# Patient Record
Sex: Female | Born: 1975 | Race: Black or African American | Hispanic: No | Marital: Single | Smoking: Current every day smoker
Health system: Southern US, Community
[De-identification: ages and names within clinical notes are randomized; demographics above are authoritative.]

## PROBLEM LIST (undated history)

## (undated) DIAGNOSIS — K829 Disease of gallbladder, unspecified: Secondary | ICD-10-CM

## (undated) HISTORY — PX: WISDOM TOOTH EXTRACTION: SHX21

## (undated) HISTORY — PX: CHOLECYSTECTOMY: SHX55

---

## 1998-10-15 ENCOUNTER — Other Ambulatory Visit: Admission: RE | Admit: 1998-10-15 | Discharge: 1998-10-15 | Payer: Self-pay | Admitting: Obstetrics

## 1999-02-12 ENCOUNTER — Inpatient Hospital Stay (HOSPITAL_COMMUNITY): Admission: AD | Admit: 1999-02-12 | Discharge: 1999-02-12 | Payer: Self-pay | Admitting: Obstetrics

## 1999-05-15 ENCOUNTER — Inpatient Hospital Stay (HOSPITAL_COMMUNITY): Admission: AD | Admit: 1999-05-15 | Discharge: 1999-05-17 | Payer: Self-pay | Admitting: Internal Medicine

## 2000-03-18 ENCOUNTER — Emergency Department (HOSPITAL_COMMUNITY): Admission: EM | Admit: 2000-03-18 | Discharge: 2000-03-19 | Payer: Self-pay | Admitting: Internal Medicine

## 2000-03-19 ENCOUNTER — Encounter: Payer: Self-pay | Admitting: Emergency Medicine

## 2001-08-12 ENCOUNTER — Emergency Department (HOSPITAL_COMMUNITY): Admission: EM | Admit: 2001-08-12 | Discharge: 2001-08-12 | Payer: Self-pay | Admitting: Emergency Medicine

## 2004-08-15 ENCOUNTER — Inpatient Hospital Stay (HOSPITAL_COMMUNITY): Admission: AD | Admit: 2004-08-15 | Discharge: 2004-08-15 | Payer: Self-pay | Admitting: Obstetrics

## 2005-10-24 ENCOUNTER — Inpatient Hospital Stay (HOSPITAL_COMMUNITY): Admission: AD | Admit: 2005-10-24 | Discharge: 2005-10-24 | Payer: Self-pay | Admitting: Obstetrics

## 2007-03-30 ENCOUNTER — Inpatient Hospital Stay (HOSPITAL_COMMUNITY): Admission: AD | Admit: 2007-03-30 | Discharge: 2007-03-30 | Payer: Self-pay | Admitting: Obstetrics

## 2007-06-07 ENCOUNTER — Inpatient Hospital Stay (HOSPITAL_COMMUNITY): Admission: AD | Admit: 2007-06-07 | Discharge: 2007-06-10 | Payer: Self-pay | Admitting: Obstetrics

## 2007-06-12 ENCOUNTER — Inpatient Hospital Stay (HOSPITAL_COMMUNITY): Admission: AD | Admit: 2007-06-12 | Discharge: 2007-06-12 | Payer: Self-pay | Admitting: Obstetrics

## 2011-02-06 LAB — CBC
HCT: 29.3 — ABNORMAL LOW
HCT: 32.8 — ABNORMAL LOW
Hemoglobin: 10.9 — ABNORMAL LOW
Hemoglobin: 11.1 — ABNORMAL LOW
Hemoglobin: 9.8 — ABNORMAL LOW
MCHC: 33.1
MCHC: 33.5
MCHC: 33.5
MCV: 88.8
MCV: 88.9
MCV: 88.9
Platelets: 271
Platelets: 286
RBC: 3.3 — ABNORMAL LOW
RBC: 3.69 — ABNORMAL LOW
RBC: 3.72 — ABNORMAL LOW
RDW: 15.8 — ABNORMAL HIGH
RDW: 15.9 — ABNORMAL HIGH
WBC: 10.1
WBC: 10.5
WBC: 8.6

## 2011-02-06 LAB — RH IMMUNE GLOB WKUP(>/=20WKS)(NOT WOMEN'S HOSP): Fetal Screen: NEGATIVE

## 2011-02-06 LAB — DIFFERENTIAL
Basophils Relative: 1
Lymphs Abs: 3.5
Monocytes Absolute: 0.5
Monocytes Relative: 5
Neutro Abs: 5.9

## 2011-02-06 LAB — RPR: RPR Ser Ql: NONREACTIVE

## 2011-02-24 LAB — RH IMMUNE GLOBULIN WORKUP (NOT WOMEN'S HOSP)
ABO/RH(D): O NEG
Antibody Screen: NEGATIVE

## 2012-11-07 ENCOUNTER — Encounter (INDEPENDENT_AMBULATORY_CARE_PROVIDER_SITE_OTHER): Payer: Self-pay | Admitting: Surgery

## 2012-11-08 ENCOUNTER — Encounter (INDEPENDENT_AMBULATORY_CARE_PROVIDER_SITE_OTHER): Payer: Self-pay | Admitting: Surgery

## 2012-11-08 ENCOUNTER — Ambulatory Visit (INDEPENDENT_AMBULATORY_CARE_PROVIDER_SITE_OTHER): Payer: 59 | Admitting: Surgery

## 2012-11-08 VITALS — BP 122/70 | HR 72 | Temp 98.6°F | Resp 15 | Ht 70.0 in | Wt 168.0 lb

## 2012-11-08 DIAGNOSIS — K8010 Calculus of gallbladder with chronic cholecystitis without obstruction: Secondary | ICD-10-CM | POA: Insufficient documentation

## 2012-11-08 DIAGNOSIS — K429 Umbilical hernia without obstruction or gangrene: Secondary | ICD-10-CM | POA: Insufficient documentation

## 2012-11-08 MED ORDER — OXYCODONE-ACETAMINOPHEN 5-325 MG PO TABS: 1.0000 | ORAL_TABLET | ORAL | Status: DC | PRN

## 2012-11-08 NOTE — Progress Notes (Signed)
Patient ID: Regina Preston, female   DOB: 01-20-1976, 37 y.o.   MRN: [id number]  Chief Complaint  Patient presents with  . New Evaluation    eval gallstones    HPI Regina Preston is a 37 y.o. female.  Referred by Dr. Ranee Gosselin for evaluation of gallbladder disease. HPI This is a 37 year old female who presents with a one-month history of abdominal bloating and right upper quadrant abdominal pain. This does get worse after eating especially greasy foods. She has had some nausea and vomiting but denies any diarrhea.  The pain radiates around into her back. She was evaluated at urgent care and was prescribed Nexium and promethazine. She then underwent an ultrasound on 11/07/12. This showed numerous shadowing gallstones and sludge within the gallbladder with some  Borderline wall thickness. She did have some tenderness with ultrasound. She presents now for surgical evaluation.  History reviewed. No pertinent past medical history.  History reviewed. No pertinent past surgical history.  Family History  Problem Relation Age of Onset  . Diabetes Father   . Stroke Father     Social History History  Substance Use Topics  . Smoking status: Current Every Day Smoker -- 0.50 packs/day    Types: Cigarettes  . Smokeless tobacco: Never Used  . Alcohol Use: Yes     Comment: occasionally    No Known Allergies  Current Outpatient Prescriptions  Medication Sig Dispense Refill  . NEXIUM 40 MG capsule       . promethazine (PHENERGAN) 12.5 MG tablet       . oxyCODONE-acetaminophen (PERCOCET/ROXICET) 5-325 MG per tablet Take 1 tablet by mouth every 4 (four) hours as needed for pain.  40 tablet  0   No current facility-administered medications for this visit.    Review of Systems Review of Systems  Constitutional: Negative for fever, chills and unexpected weight change.  HENT: Negative for hearing loss, congestion, sore throat, trouble swallowing and voice change.   Eyes: Negative for visual  disturbance.  Respiratory: Negative for cough and wheezing.   Cardiovascular: Negative for chest pain, palpitations and leg swelling.  Gastrointestinal: Positive for nausea, vomiting, abdominal pain and abdominal distention. Negative for diarrhea, constipation, blood in stool and anal bleeding.  Genitourinary: Negative for hematuria, vaginal bleeding and difficulty urinating.  Musculoskeletal: Negative for arthralgias.  Skin: Negative for rash and wound.  Neurological: Negative for seizures, syncope and headaches.  Hematological: Negative for adenopathy. Does not bruise/bleed easily.  Psychiatric/Behavioral: Negative for confusion.    Blood pressure 122/70, pulse 72, temperature 98.6 F (37 C), temperature source Temporal, resp. rate 15, height 5\' 10"  (1.778 m), weight 168 lb (76.204 kg).  Physical Exam Physical Exam WDWN in NAD HEENT:  EOMI, sclera anicteric Neck:  No masses, no thyromegaly Lungs:  CTA bilaterally; normal respiratory effort CV:  Regular rate and rhythm; no murmurs Abd:  +bowel sounds, soft, mildly tender in RUQ; no palpable masses Ext:  Well-perfused; no edema Skin:  Warm, dry; no sign of jaundice  Data Reviewed [place] 11/07/12 - Cholelithiasis; borderline wall thickening.  Assessment    Symptomatic cholelithiasis.       Plan    Laparoscopic cholecystectomy with intraoperative cholangiogram. The surgical procedure has been discussed with the patient.  Potential risks, benefits, alternative treatments, and expected outcomes have been explained.  All of the patient's questions at this time have been answered.  The likelihood of reaching the patient's treatment goal is good.  The patient understand the proposed surgical procedure and wishes  to proceed.         Davon Abdelaziz K. 11/08/2012, 4:43 PM

## 2012-11-14 ENCOUNTER — Telehealth (INDEPENDENT_AMBULATORY_CARE_PROVIDER_SITE_OTHER): Payer: Self-pay | Admitting: *Deleted

## 2012-11-14 ENCOUNTER — Emergency Department (HOSPITAL_COMMUNITY): Payer: 59

## 2012-11-14 ENCOUNTER — Emergency Department (HOSPITAL_COMMUNITY)
Admission: EM | Admit: 2012-11-14 | Discharge: 2012-11-14 | Disposition: A | Discharge status: Home / Self Care | Payer: 59 | Attending: Emergency Medicine | Admitting: Emergency Medicine

## 2012-11-14 ENCOUNTER — Encounter (HOSPITAL_COMMUNITY): Payer: Self-pay | Admitting: Emergency Medicine

## 2012-11-14 DIAGNOSIS — F17200 Nicotine dependence, unspecified, uncomplicated: Secondary | ICD-10-CM | POA: Insufficient documentation

## 2012-11-14 DIAGNOSIS — R109 Unspecified abdominal pain: Secondary | ICD-10-CM

## 2012-11-14 DIAGNOSIS — R197 Diarrhea, unspecified: Secondary | ICD-10-CM | POA: Insufficient documentation

## 2012-11-14 DIAGNOSIS — K8020 Calculus of gallbladder without cholecystitis without obstruction: Secondary | ICD-10-CM | POA: Insufficient documentation

## 2012-11-14 DIAGNOSIS — Z79899 Other long term (current) drug therapy: Secondary | ICD-10-CM | POA: Insufficient documentation

## 2012-11-14 DIAGNOSIS — Z3202 Encounter for pregnancy test, result negative: Secondary | ICD-10-CM | POA: Insufficient documentation

## 2012-11-14 DIAGNOSIS — R630 Anorexia: Secondary | ICD-10-CM | POA: Insufficient documentation

## 2012-11-14 DIAGNOSIS — R112 Nausea with vomiting, unspecified: Secondary | ICD-10-CM | POA: Insufficient documentation

## 2012-11-14 HISTORY — DX: Disease of gallbladder, unspecified: K82.9

## 2012-11-14 LAB — URINALYSIS, ROUTINE W REFLEX MICROSCOPIC
Ketones, ur: 15 mg/dL — AB
Leukocytes, UA: NEGATIVE
Protein, ur: NEGATIVE mg/dL
Urobilinogen, UA: 1 mg/dL (ref 0.0–1.0)

## 2012-11-14 LAB — CBC
MCH: 28.6 pg (ref 26.0–34.0)
MCHC: 34.4 g/dL (ref 30.0–36.0)
Platelets: 307 10*3/uL (ref 150–400)
RBC: 4.23 MIL/uL (ref 3.87–5.11)
RDW: 14.4 % (ref 11.5–15.5)

## 2012-11-14 LAB — HEPATIC FUNCTION PANEL
Bilirubin, Direct: <0.1 mg/dL (ref 0.0–0.3)
Total Protein: 7.4 g/dL (ref 6.0–8.3)

## 2012-11-14 LAB — POCT I-STAT, CHEM 8
Calcium, Ion: 1.27 mmol/L — ABNORMAL HIGH (ref 1.12–1.23)
HCT: 38 % (ref 36.0–46.0)
Hemoglobin: 12.9 g/dL (ref 12.0–15.0)
Sodium: 141 mEq/L (ref 135–145)
TCO2: 28 mmol/L (ref 0–100)

## 2012-11-14 LAB — POCT PREGNANCY, URINE: Preg Test, Ur: NEGATIVE

## 2012-11-14 LAB — LIPASE, BLOOD: Lipase: 11 U/L (ref 11–59)

## 2012-11-14 MED ORDER — ONDANSETRON HCL 4 MG/2ML IJ SOLN
4.0000 mg | Freq: Once | INTRAMUSCULAR | Status: AC
  Administered 2012-11-14: 4 mg via INTRAVENOUS
  Filled 2012-11-14: qty 2

## 2012-11-14 MED ORDER — HYDROMORPHONE HCL PF 1 MG/ML IJ SOLN: 1.0000 mg | Freq: Once | INTRAMUSCULAR | Status: DC

## 2012-11-14 MED ORDER — IOHEXOL 300 MG/ML  SOLN
100.0000 mL | Freq: Once | INTRAMUSCULAR | Status: AC | PRN
  Administered 2012-11-14: 100 mL via INTRAVENOUS

## 2012-11-14 MED ORDER — DICYCLOMINE HCL 20 MG PO TABS: 20.0000 mg | ORAL_TABLET | Freq: Two times a day (BID) | ORAL | Status: DC

## 2012-11-14 MED ORDER — SODIUM CHLORIDE 0.9 % IV BOLUS (SEPSIS)
1000.0000 mL | Freq: Once | INTRAVENOUS | Status: AC
  Administered 2012-11-14: 1000 mL via INTRAVENOUS

## 2012-11-14 MED ORDER — KETOROLAC TROMETHAMINE 15 MG/ML IJ SOLN
15.0000 mg | Freq: Once | INTRAMUSCULAR | Status: AC
  Administered 2012-11-14: 15 mg via INTRAVENOUS
  Filled 2012-11-14: qty 1

## 2012-11-14 MED ORDER — GI COCKTAIL ~~LOC~~
30.0000 mL | Freq: Once | ORAL | Status: AC
  Administered 2012-11-14: 30 mL via ORAL
  Filled 2012-11-14: qty 30

## 2012-11-14 NOTE — ED Notes (Signed)
Pt states that the narcotics makes the pain worse.

## 2012-11-14 NOTE — ED Notes (Signed)
PT reports hx of gallbladder attacks for several months; went to urgent care recently then consult with [facility]. Has surgery date for July 21st. Has px for narcotics but reports it seems to just make things worse. PT is tearful. Last time she ate was dinner last night. She is nauseated as well.

## 2012-11-14 NOTE — ED Provider Notes (Signed)
History    CSN: [id number] Arrival date & time 11/14/12  1334  First MD Initiated Contact with Patient 11/14/12 1855     Chief Complaint  Patient presents with  . Abdominal Pain   (Consider location/radiation/quality/duration/timing/severity/associated sxs/prior Treatment) Patient is a 37 y.o. female presenting with abdominal pain.  Abdominal Pain This is a new problem. Episode onset: 6 months ago. The problem occurs constantly. The problem has been rapidly worsening. Associated symptoms include abdominal pain, anorexia, nausea and vomiting. Pertinent negatives include no chest pain, chills, congestion, coughing, fever, numbness, rash, sore throat, swollen glands, vertigo or visual change. The symptoms are aggravated by eating. She has tried nothing for the symptoms.   Past Medical History  Diagnosis Date  . Gallbladder attack    No past surgical history on file. Family History  Problem Relation Age of Onset  . Diabetes Father   . Stroke Father    History  Substance Use Topics  . Smoking status: Current Every Day Smoker -- 0.50 packs/day    Types: Cigarettes  . Smokeless tobacco: Never Used  . Alcohol Use: Yes     Comment: occasionally   OB History   Grav Para Term Preterm Abortions TAB SAB Ect Mult Living                 Review of Systems  Constitutional: Negative for fever and chills.  HENT: Negative for congestion, sore throat and rhinorrhea.   Eyes: Negative for photophobia and visual disturbance.  Respiratory: Negative for cough and shortness of breath.   Cardiovascular: Negative for chest pain and leg swelling.  Gastrointestinal: Positive for nausea, vomiting, abdominal pain and anorexia. Negative for diarrhea and constipation.  Endocrine: Negative for polyphagia and polyuria.  Genitourinary: Negative for dysuria, flank pain, vaginal bleeding, vaginal discharge and enuresis.  Musculoskeletal: Negative for back pain and gait problem.  Skin: Negative for color  change and rash.  Neurological: Negative for dizziness, vertigo, syncope, light-headedness and numbness.  Hematological: Negative for adenopathy. Does not bruise/bleed easily.  All other systems reviewed and are negative.    Allergies  Review of patient's allergies indicates no known allergies.  Home Medications   Current Outpatient Rx  Name  Route  Sig  Dispense  Refill  . NEXIUM 40 MG capsule   Oral   Take 40 mg by mouth daily before breakfast.          . oxyCODONE-acetaminophen (PERCOCET/ROXICET) 5-325 MG per tablet   Oral   Take 1 tablet by mouth every 4 (four) hours as needed for pain.         . promethazine (PHENERGAN) 12.5 MG tablet   Oral   Take 12.5 mg by mouth every 6 (six) hours as needed for nausea.          Marland Kitchen dicyclomine (BENTYL) 20 MG tablet   Oral   Take 1 tablet (20 mg total) by mouth 2 (two) times daily.   20 tablet   0    BP 121/84  Pulse 62  Temp(Src) 98.6 F (37 C) (Oral)  Resp 20  SpO2 100%  LMP 11/12/2012 Physical Exam  Vitals reviewed. Constitutional: She is oriented to person, place, and time. She appears well-developed and well-nourished.  HENT:  Head: Normocephalic and atraumatic.  Right Ear: External ear normal.  Left Ear: External ear normal.  Eyes: Conjunctivae and EOM are normal. Pupils are equal, round, and reactive to light.  Neck: Normal range of motion. Neck supple.  Cardiovascular: Normal rate,  regular rhythm, normal heart sounds and intact distal pulses.   Pulmonary/Chest: Effort normal and breath sounds normal.  Abdominal: Soft. Bowel sounds are normal. There is tenderness in the right upper quadrant, right lower quadrant and suprapubic area. There is no CVA tenderness and negative Murphy's sign.  Musculoskeletal: Normal range of motion.  Neurological: She is alert and oriented to person, place, and time.  Skin: Skin is warm and dry.    ED Course  Procedures (including critical care time) Labs Reviewed  CBC -  Abnormal; Notable for the following:    WBC 12.4 (*)    HCT 35.2 (*)    All other components within normal limits  URINALYSIS, ROUTINE W REFLEX MICROSCOPIC - Abnormal; Notable for the following:    APPearance CLOUDY (*)    pH 8.5 (*)    Ketones, ur 15 (*)    All other components within normal limits  POCT I-STAT, CHEM 8 - Abnormal; Notable for the following:    BUN 5 (*)    Glucose, Bld 124 (*)    Calcium, Ion 1.27 (*)    All other components within normal limits  HEPATIC FUNCTION PANEL  LIPASE, BLOOD  LACTIC ACID, PLASMA  POCT PREGNANCY, URINE   Ct Abdomen Pelvis W Contrast  11/14/2012   *RADIOLOGY REPORT*  Clinical Data: Abdominal pain.  Nausea and vomiting.  CT ABDOMEN AND PELVIS WITH CONTRAST  Technique:  Multidetector CT imaging of the abdomen and pelvis was performed following the standard protocol during bolus administration of intravenous contrast.  Contrast: OMNIPAQUE IOHEXOL 300 MG/ML  SOLN  Comparison: None.  Findings: Lung Bases: Motion artifact present at the lung bases. No gross opacity.  Liver:  Riedel's lobe of the liver.  Fatty infiltration adjacent to the falciform ligament.  No mass lesion.  Hyper enhancing lesion in the anterior liver (image number 15 series 7) most compatible with flash fill hemangioma.  No architectural distortion of the liver.  Spleen:  Normal.  Gallbladder:  Abnormal appearance of the gallbladder with high attenuation diffusely.  Scattered foci of low attenuation are present, compatible with small floating gallstones within the biliary sludge.  Some of these probably have nitrogen gas centrally.  Calcified gallstone near the neck.  No inflammatory changes by CT.  Common bile duct:  Within normal limits.  Pancreas:  Normal.  Adrenal glands:  Normal bilaterally.  Kidneys:  Right renal cystic lesions compatible with simple cysts. Normal enhancement.  Visualized portions of the ureters appear within normal limits.  Stomach:  Normal.  Small bowel:   Duodenum normal.  There is no mesenteric adenopathy. No intra-abdominal free air.  Colon:   Liquid stool is present within the colon.  This is abnormal but nonspecific.  Gas filled appendix is identified extending into the anatomic pelvis.  There is no periappendiceal phlegmon to suggest acute appendicitis.  No discrete mural thickening of the colon.  Distal transverse and descending colon decompressed.  Pelvic Genitourinary:  Retroverted anteflexed uterus.  Urinary bladder appears normal.  No free fluid in the anatomic pelvis. Paucity of abdominal fat makes evaluation of pelvic viscera difficult.  Tampon is present in the vagina.  Bones:  No aggressive osseous lesions.  Vasculature: Grossly normal.  Body Wall: Fat containing periumbilical hernia.  IMPRESSION: 1.  Cholelithiasis and biliary sludge.  No definite inflammatory changes of the gallbladder by CT.  If there is suspicion for cholecystitis, ultrasound should be obtained. 2.  Right renal cysts. 3.  Nonspecific fluid within the colon,  most commonly associated with enteric infection.  Normal appendix in the anatomic pelvis. 4.  Hyperenhancing lesion in the anterior liver most compatible with flash fill hemangioma.   Original Report Authenticated By: Andreas Newport, M.D.   1. Abdominal pain   2. Cholelithiasis     MDM  37 y.o. female  with pertinent PMH of gallstones and biliary colic presents with acute on chronic abd pain.  Diagnosed with stones last week, no cholecystitis, pt afebrile, however has had nausea/vomiting along with diarrhea.  Physical exam as above with RUQ and RLQ tenderness.  No CVA tenderness.  Pt has no ho abd surgery.  Labs as above unremarkable.  Given unremarkable labs, feel that pt warrants ct scan for RLQ tenderness and possibility of occult pathology, this returned as above with biliary sludge, cholelithiasis, no appendicitis, likely infectious colitis.  Pt symptomatically improved after toradol and GI cocktail.  She feels ready  to return home.  Given strict return precautions for changing or worsening symptoms, voices understanding, and agrees to fu. Doubt cholecystitis given normal labs, normal CT, no murphy's sign.  Also doubt mesenteric ischemia, panceratitis, or other emergent pathology.       Labs and imaging as above reviewed by myself and attending,Dr. Freida Busman, with whom case was discussed.   1. Abdominal pain   2. Cholelithiasis       Noel Gerold, MD 11/14/12 2322

## 2012-11-14 NOTE — Telephone Encounter (Signed)
Patient's sister called back stating she is still having a lot of pain and is requesting a stronger pain medication. She is currently taking oxycodone. I advised this is as strong as we can prescribe as an outpatient and advised if she is still having this much pain while taking oxycodone she should go to the ER. She expressed understanding.

## 2012-11-14 NOTE — Telephone Encounter (Signed)
Received a call from patient's sister who stated that patient is having increased number of gallbladder attacks and is having difficulty with pain control.  Patient is scheduled for surgery on 12/05/12.  Sister does not feel patient can wait that long due to increasing number of attacks and symptoms becoming worse.  Advised sister that if patient feels this is emergent then they would need to go to either [facility] or [facility] where the ED physician could assess patient and needs.  Sister states understanding at this time and will speak with patient regarding options.

## 2012-11-15 NOTE — ED Provider Notes (Signed)
I saw and evaluated the patient, reviewed the resident's note and I agree with the findings and plan.  Toy Baker, MD 11/15/12 2225

## 2012-11-25 ENCOUNTER — Encounter (INDEPENDENT_AMBULATORY_CARE_PROVIDER_SITE_OTHER): Payer: Self-pay

## 2012-11-30 ENCOUNTER — Encounter (INDEPENDENT_AMBULATORY_CARE_PROVIDER_SITE_OTHER): Payer: Self-pay | Admitting: General Surgery

## 2012-11-30 ENCOUNTER — Telehealth (INDEPENDENT_AMBULATORY_CARE_PROVIDER_SITE_OTHER): Payer: Self-pay

## 2012-11-30 NOTE — Telephone Encounter (Signed)
Pt calling in requesting a note for her employer of the surgery date and when she will be a 100% to go back to work with no restrictions. The pt will pick up the letter once you call her to notify her it's ready.

## 2012-12-05 ENCOUNTER — Other Ambulatory Visit (INDEPENDENT_AMBULATORY_CARE_PROVIDER_SITE_OTHER): Payer: Self-pay | Admitting: Surgery

## 2012-12-05 DIAGNOSIS — K429 Umbilical hernia without obstruction or gangrene: Secondary | ICD-10-CM

## 2012-12-05 DIAGNOSIS — K8010 Calculus of gallbladder with chronic cholecystitis without obstruction: Secondary | ICD-10-CM

## 2012-12-16 ENCOUNTER — Ambulatory Visit (INDEPENDENT_AMBULATORY_CARE_PROVIDER_SITE_OTHER): Payer: 59 | Admitting: Surgery

## 2012-12-16 ENCOUNTER — Encounter (INDEPENDENT_AMBULATORY_CARE_PROVIDER_SITE_OTHER): Payer: Self-pay | Admitting: Surgery

## 2012-12-16 VITALS — BP 100/68 | HR 76 | Temp 98.5°F | Resp 16 | Ht 70.0 in | Wt 161.2 lb

## 2012-12-16 DIAGNOSIS — K8010 Calculus of gallbladder with chronic cholecystitis without obstruction: Secondary | ICD-10-CM

## 2012-12-16 DIAGNOSIS — K429 Umbilical hernia without obstruction or gangrene: Secondary | ICD-10-CM

## 2012-12-16 NOTE — Progress Notes (Signed)
Status post laparoscopic cholecystectomy as well as umbilical hernia repair on 12/05/12. Pathology showed chronic crackles cholecystitis. The patient is doing quite well. Still some soreness at her umbilicus but otherwise is pain-free. Normal appetite and bowel movements. She was constipated for a couple of days but this has resolved.  Her incisions all seem to be healing well with no sign of infection. No sign of recurrent hernia.  The patient's job requires a lot of heavy lifting. We will keep her out of work until 12/26/12. She may then return to full duty. Followup when necessary.  Wilmon Arms. Corliss Skains, MD, [facility]  General/ Trauma Surgery  12/16/2012 12:25 PM

## 2013-12-02 IMAGING — CT CT ABD-PELV W/ CM
2 of 4 series · 15 of 46 positions shown, 17 images · IV contrast (APPLIED)
Comparison: None.

CLINICAL DATA: Abdominal pain.  Nausea and vomiting.

CT ABDOMEN AND PELVIS WITH CONTRAST
TECHNIQUE: Multidetector CT imaging of the abdomen and pelvis was
performed following the standard protocol during bolus
administration of intravenous contrast.
Contrast: 100mL OMNIPAQUE IOHEXOL 300 MG/ML  SOLN

[Series 5: cor st · coronal · 0.67mm/px · 3 of 79 slices shown]
[im 27/79  soft-tissue]
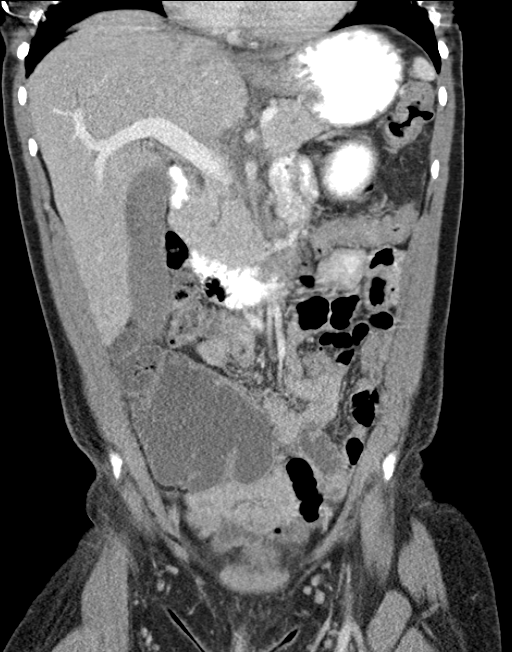
[im 35/79  soft-tissue]
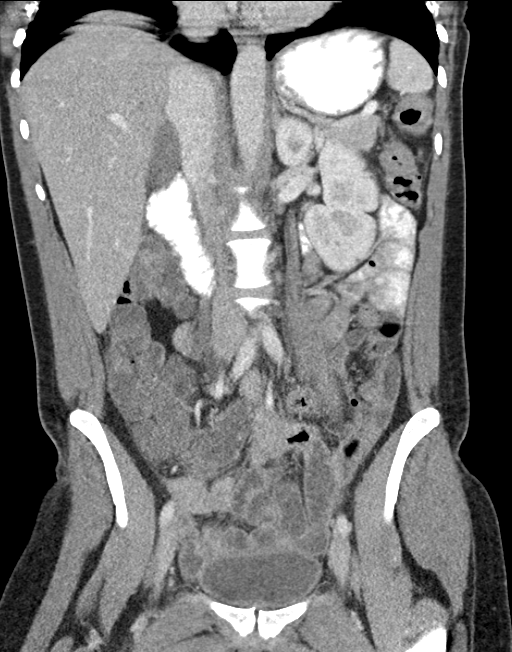
[im 44/79  soft-tissue]
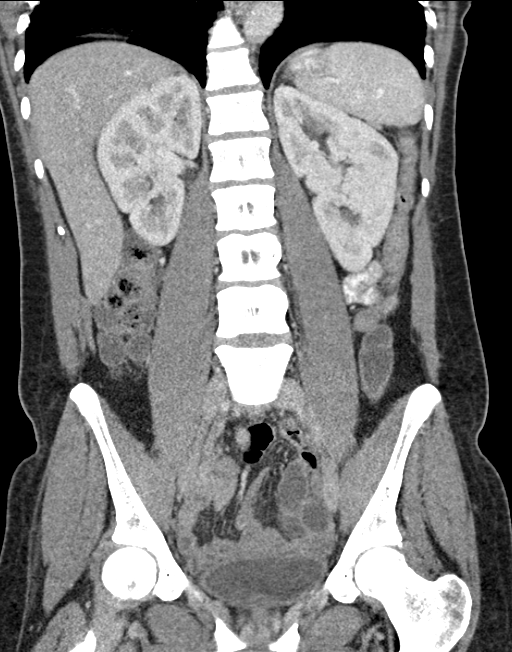

[Series 7: abd/ pelvis 5.0 i30f 1 · axial · 0.65mm/px · z∈[+114,+509]mm · 12 of 87 slices shown, 14 images]
[im 4/87  soft-tissue]
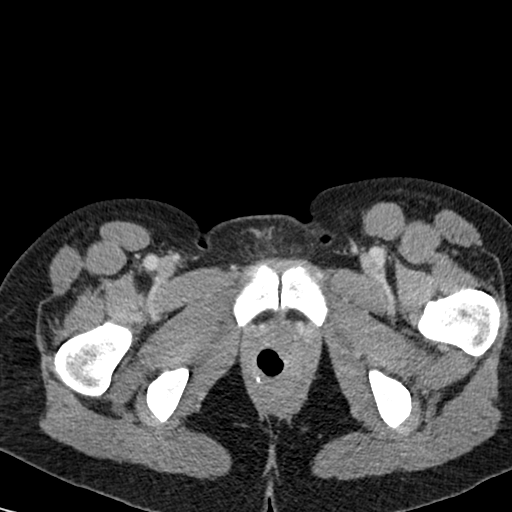
[im 4/87  bone]
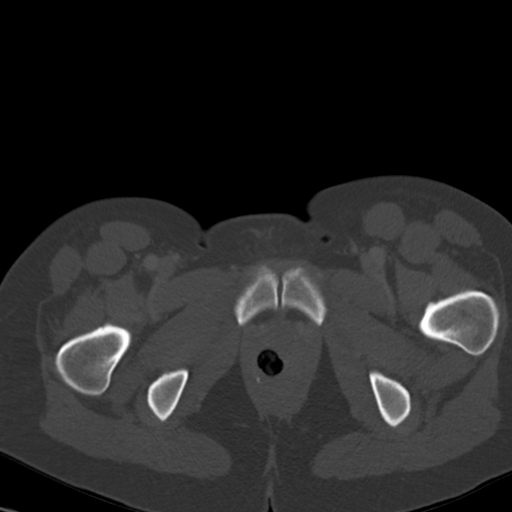
[im 11/87  soft-tissue]
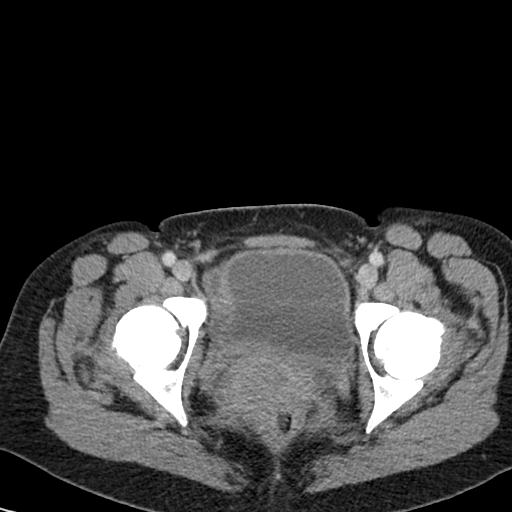
[im 18/87  soft-tissue]
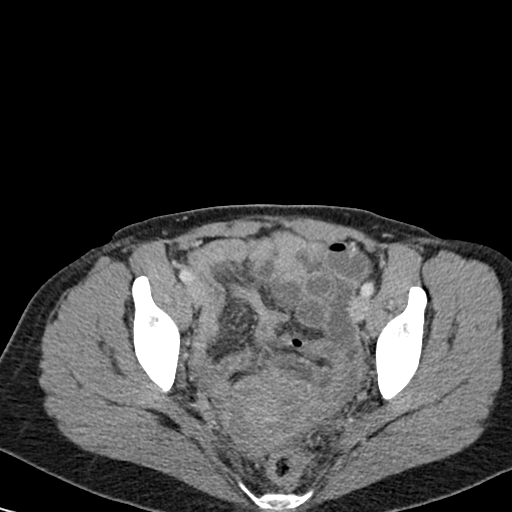
[im 26/87  soft-tissue]
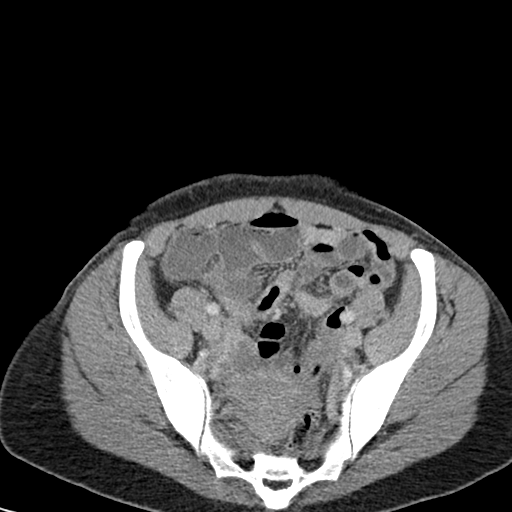
[im 33/87  soft-tissue]
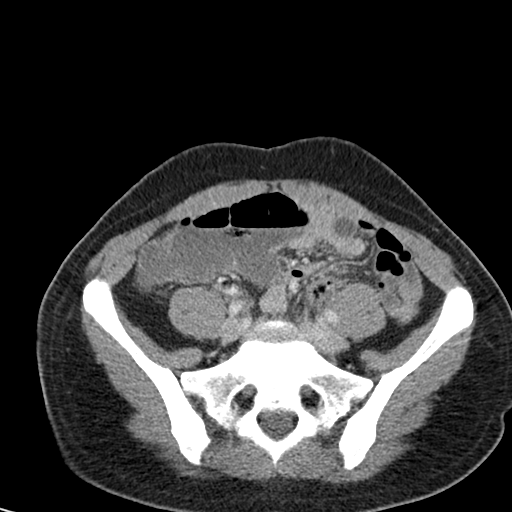
[im 40/87  soft-tissue]
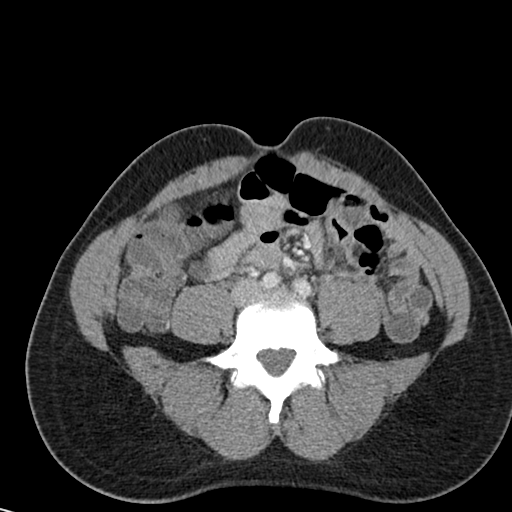
[im 47/87  soft-tissue]
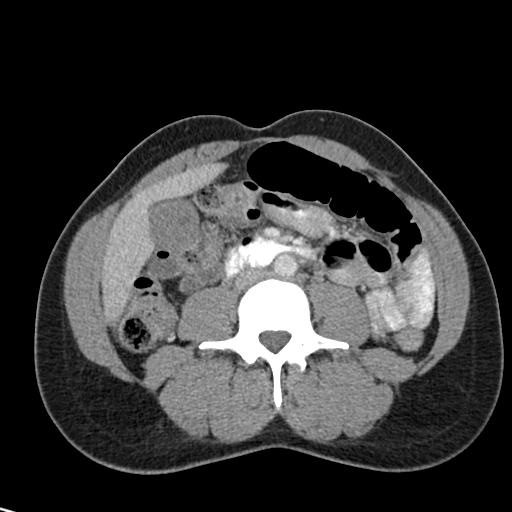
[im 54/87  soft-tissue]
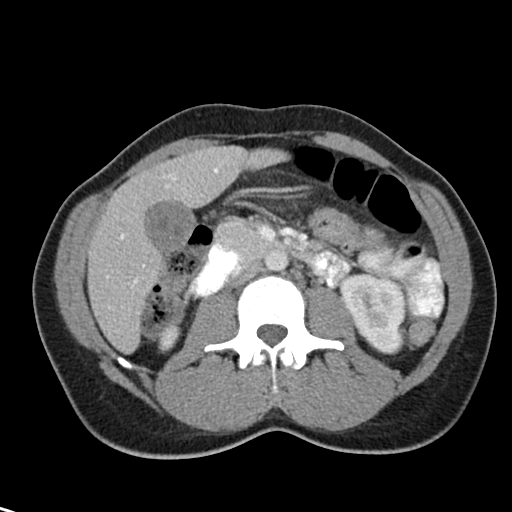
[im 61/87  soft-tissue]
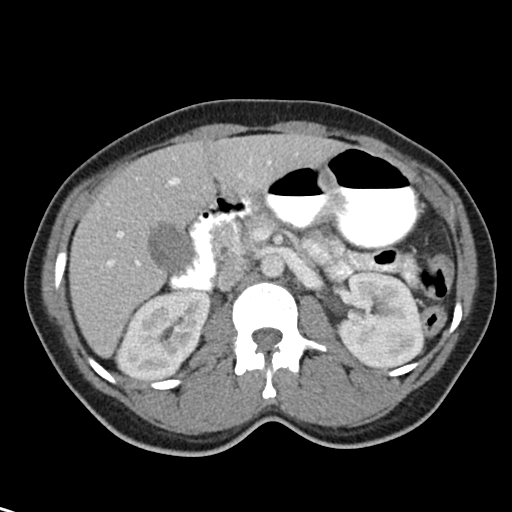
[im 61/87  bone]
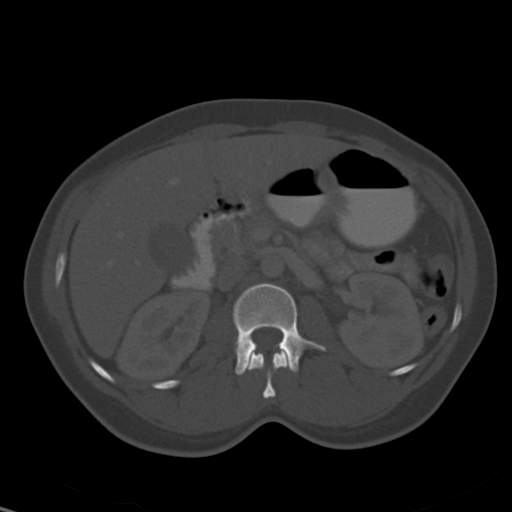
[im 69/87  soft-tissue]
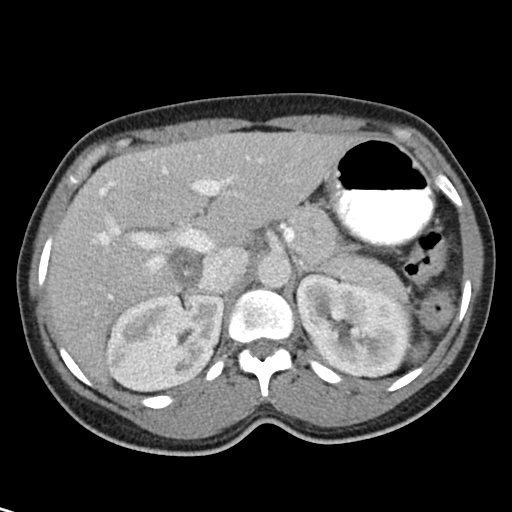
[im 76/87  soft-tissue]
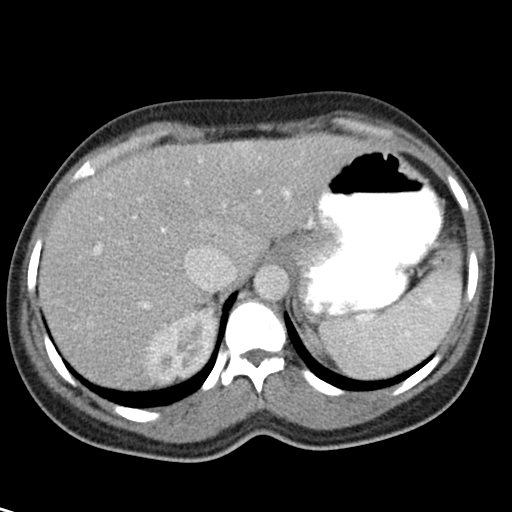
[im 83/87  soft-tissue]
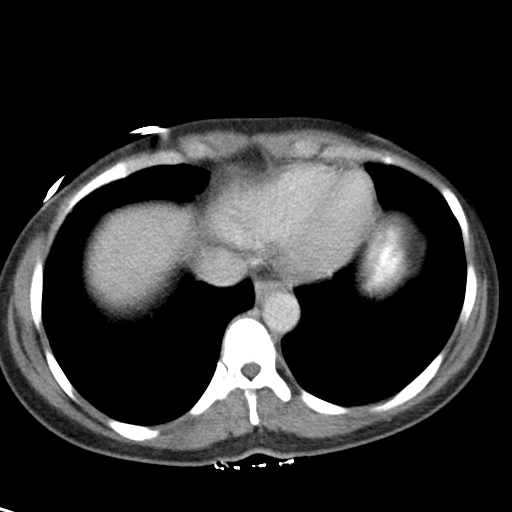

[15 of 46 positions shown; findings below may reference images not displayed]

FINDINGS: Lung Bases: Motion artifact present at the lung bases.
No gross opacity.

Liver:  Riedel's lobe of the liver.  Fatty infiltration adjacent to
the falciform ligament.  No mass lesion.  Hyper enhancing lesion in
the anterior liver (image number 15 series 7) most compatible with
flash fill hemangioma.  No architectural distortion of the liver.

Spleen:  Normal.

Gallbladder:  Abnormal appearance of the gallbladder with high
attenuation diffusely.  Scattered foci of low attenuation are
present, compatible with small floating gallstones within the
biliary sludge.  Some of these probably have nitrogen gas
centrally.  Calcified gallstone near the neck.  No inflammatory
changes by CT.

Common bile duct:  Within normal limits.

Pancreas:  Normal.

Adrenal glands:  Normal bilaterally.

Kidneys:  Right renal cystic lesions compatible with simple cysts.
Normal enhancement.  Visualized portions of the ureters appear
within normal limits.

Stomach:  Normal.

Small bowel:  Duodenum normal.  There is no mesenteric adenopathy.
No intra-abdominal free air.

Colon:   Liquid stool is present within the colon.  This is
abnormal but nonspecific.  Gas filled appendix is identified
extending into the anatomic pelvis.  There is no periappendiceal
phlegmon to suggest acute appendicitis.  No discrete mural
thickening of the colon.  Distal transverse and descending colon
decompressed.

Pelvic Genitourinary:  Retroverted anteflexed uterus.  Urinary
bladder appears normal.  No free fluid in the anatomic pelvis.
Paucity of abdominal fat makes evaluation of pelvic viscera
difficult.  Tampon is present in the vagina.

Bones:  No aggressive osseous lesions.

Vasculature: Grossly normal.

Body Wall: Fat containing periumbilical hernia.
IMPRESSION: 1.  Cholelithiasis and biliary sludge.  No definite inflammatory
changes of the gallbladder by CT.  If there is suspicion for
cholecystitis, ultrasound should be obtained.
2.  Right renal cysts.
3.  Nonspecific fluid within the colon, most commonly associated
with enteric infection.  Normal appendix in the anatomic pelvis.
4.  Hyperenhancing lesion in the anterior liver most compatible
with flash fill hemangioma.

## 2015-03-29 ENCOUNTER — Other Ambulatory Visit (HOSPITAL_COMMUNITY)
Admission: RE | Admit: 2015-03-29 | Discharge: 2015-03-29 | Disposition: A | Discharge status: Home / Self Care | Payer: Commercial Managed Care - HMO | Source: Ambulatory Visit | Attending: Family Medicine | Admitting: Family Medicine

## 2015-03-29 DIAGNOSIS — Z113 Encounter for screening for infections with a predominantly sexual mode of transmission: Secondary | ICD-10-CM | POA: Insufficient documentation

## 2015-03-29 DIAGNOSIS — Z01419 Encounter for gynecological examination (general) (routine) without abnormal findings: Secondary | ICD-10-CM | POA: Diagnosis present

## 2021-10-16 ENCOUNTER — Ambulatory Visit (HOSPITAL_COMMUNITY)
Admission: EM | Admit: 2021-10-16 | Discharge: 2021-10-16 | Disposition: A | Discharge status: Home / Self Care | Payer: 59 | Attending: Emergency Medicine | Admitting: Emergency Medicine

## 2021-10-16 ENCOUNTER — Encounter (HOSPITAL_COMMUNITY): Payer: Self-pay

## 2021-10-16 DIAGNOSIS — L02411 Cutaneous abscess of right axilla: Secondary | ICD-10-CM

## 2021-10-16 MED ORDER — DOXYCYCLINE HYCLATE 100 MG PO CAPS: 100.0000 mg | ORAL_CAPSULE | Freq: Two times a day (BID) | ORAL | 0 refills | Status: AC

## 2021-10-16 NOTE — Discharge Instructions (Signed)
Take doxycyline twice daily for 7 days  Hold warm-hot compresses to affected area at least 4 times a day, this helps to facilitate draining, the more the better  Please return for evaluation for increased swelling, increased tenderness or pain, non healing site, non draining site, you begin to have fever or chills   You will need to follow-up with general surgery if your abscesses have been reoccurring in the same location for evaluation of sac removal  We reviewed the etiology of recurrent abscesses of skin.  Skin abscesses are collections of pus within the dermis and deeper skin tissues. Skin abscesses manifest as painful, tender, fluctuant, and erythematous nodules, frequently surmounted by a pustule and surrounded by a rim of erythematous swelling.  Spontaneous drainage of purulent material may occur.  Fever can occur on occasion.    -Skin abscesses can develop in healthy individuals with no predisposing conditions other than skin or nasal carriage of Staphylococcus aureus.  Individuals in close contact with others who have active infection with skin abscesses are at increased risk which is likely to explain why twin brother has similar episodes.   In addition, any process leading to a breach in the skin barrier can also predispose to the development of a skin abscesses, such as atopic dermatitis.

## 2021-10-16 NOTE — ED Provider Notes (Signed)
[facility]    CSN: [id number] Arrival date & time: 10/16/21  1707      History   Chief Complaint Chief Complaint  Patient presents with   Abscess    HPI Regina Preston is a 46 y.o. female.   Presents with cyst under the right arm for 7 days.  Has increased in size and become tender and painful.  Has attempted use of warm compresses which has been ineffective.  Endorses cyst in the same location prior to.  Denies fever, chills or drainage.    Past Medical History:  Diagnosis Date   Gallbladder attack     Patient Active Problem List   Diagnosis Date Noted   Chronic cholecystitis with calculus 11/08/2012   Umbilical hernia 11/08/2012    Past Surgical History:  Procedure Laterality Date   CHOLECYSTECTOMY     WISDOM TOOTH EXTRACTION      OB History   No obstetric history on file.      Home Medications    Prior to Admission medications   Not on File    Family History Family History  Problem Relation Age of Onset   Diabetes Father    Stroke Father     Social History Social History   Tobacco Use   Smoking status: Every Day    Packs/day: 0.50    Types: Cigarettes   Smokeless tobacco: Never  Substance Use Topics   Alcohol use: Yes    Comment: occasionally   Drug use: No     Allergies   Patient has no known allergies.   Review of Systems Review of Systems  Constitutional: Negative.   Respiratory: Negative.    Cardiovascular: Negative.   Skin:  Positive for wound. Negative for color change, pallor and rash.    Physical Exam Triage Vital Signs ED Triage Vitals  Enc Vitals Group     BP 10/16/21 1810 116/82     Pulse Rate 10/16/21 1810 70     Resp 10/16/21 1810 18     Temp 10/16/21 1810 98.1 F (36.7 C)     Temp Source 10/16/21 1810 Oral     SpO2 10/16/21 1810 100 %     Weight --      Height --      Head Circumference --      Peak Flow --      Pain Score 10/16/21 1814 9     Pain Loc --      Pain Edu? --      Excl.  in GC? --    No data found.  Updated Vital Signs BP 116/82 (BP Location: Left Arm)   Pulse 70   Temp 98.1 F (36.7 C) (Oral)   Resp 18   SpO2 100%   Visual Acuity Right Eye Distance:   Left Eye Distance:   Bilateral Distance:    Right Eye Near:   Left Eye Near:    Bilateral Near:     Physical Exam Constitutional:      Appearance: Normal appearance.  HENT:     Head: Normocephalic.  Eyes:     Extraocular Movements: Extraocular movements intact.  Pulmonary:     Effort: Pulmonary effort is normal.  Skin:    Comments: 3 x 4 abscess present to the right axilla, erythematous and tender  Neurological:     Mental Status: She is alert and oriented to person, place, and time. Mental status is at baseline.  Psychiatric:  Mood and Affect: Mood normal.        Behavior: Behavior normal.     UC Treatments / Results  Labs (all labs ordered are listed, but only abnormal results are displayed) Labs Reviewed - No data to display  EKG   Radiology No results found.  Procedures Incision and Drainage  Date/Time: 10/20/2021 8:39 AM Performed by: Valinda Hoar, NP Authorized by: Valinda Hoar, NP   Consent:    Consent obtained:  Verbal   Consent given by:  Patient   Risks discussed:  Incomplete drainage   Alternatives discussed:  No treatment Universal protocol:    Procedure explained and questions answered to patient or proxy's satisfaction: yes     Patient identity confirmed:  Verbally with patient Location:    Type:  Abscess   Size:  3x4   Location: right axilla. Pre-procedure details:    Skin preparation:  Povidone-iodine Anesthesia:    Anesthesia method:  Local infiltration   Local anesthetic:  Lidocaine 1% w/o epi Procedure type:    Complexity:  Simple Procedure details:    Ultrasound guidance: no     Needle aspiration: no     Incision types:  Stab incision   Drainage:  Purulent   Drainage amount:  Copious   Wound treatment:  Wound left  open   Packing materials:  None Post-procedure details:    Procedure completion:  Tolerated (including critical care time)  Medications Ordered in UC Medications - No data to display  Initial Impression / Assessment and Plan / UC Course  I have reviewed the triage vital signs and the nursing notes.  Pertinent labs & imaging results that were available during my care of the patient were reviewed by me and considered in my medical decision making (see chart for details).  Abscess of the right axilla  Vital signs are stable, temperature 98.1, no signs of sepsis, able to complete I&D to abscess, copious drainage expelled, doxycycline 7-day course prescribed, recommended continued warm compresses to the affected area, may use Tylenol and ibuprofen for general comfort, may follow-up with urgent care as needed for nonhealing or nondraining site Final Clinical Impressions(s) / UC Diagnoses   Final diagnoses:  None   Discharge Instructions   None    ED Prescriptions   None    PDMP not reviewed this encounter.   Valinda Hoar, [place] 10/20/21 [phone]

## 2021-10-16 NOTE — ED Triage Notes (Signed)
Pt presents with abscess under right arm X 1 week.

## 2021-10-20 DIAGNOSIS — L02411 Cutaneous abscess of right axilla: Secondary | ICD-10-CM | POA: Diagnosis not present

## 2022-06-16 ENCOUNTER — Ambulatory Visit
Admission: RE | Admit: 2022-06-16 | Discharge: 2022-06-16 | Disposition: A | Discharge status: Home / Self Care | Payer: Worker's Compensation | Source: Ambulatory Visit | Attending: Emergency Medicine | Admitting: Emergency Medicine

## 2022-06-16 ENCOUNTER — Other Ambulatory Visit: Payer: Self-pay | Admitting: Emergency Medicine

## 2022-06-16 ENCOUNTER — Encounter: Payer: Self-pay | Admitting: Emergency Medicine

## 2022-06-16 DIAGNOSIS — S8990XA Unspecified injury of unspecified lower leg, initial encounter: Secondary | ICD-10-CM

## 2023-01-06 ENCOUNTER — Ambulatory Visit: Payer: 59 | Admitting: Internal Medicine
